# Patient Record
Sex: Female | Born: 1975 | Race: White | Hispanic: No | Marital: Married | Smoking: Never smoker
Health system: Southern US, Community
[De-identification: ages and names within clinical notes are randomized; demographics above are authoritative.]

## PROBLEM LIST (undated history)

## (undated) DIAGNOSIS — F4310 Post-traumatic stress disorder, unspecified: Secondary | ICD-10-CM

## (undated) DIAGNOSIS — F32A Depression, unspecified: Secondary | ICD-10-CM

## (undated) DIAGNOSIS — F419 Anxiety disorder, unspecified: Secondary | ICD-10-CM

## (undated) DIAGNOSIS — F329 Major depressive disorder, single episode, unspecified: Secondary | ICD-10-CM

## (undated) HISTORY — DX: Depression, unspecified: F32.A

## (undated) HISTORY — DX: Post-traumatic stress disorder, unspecified: F43.10

## (undated) HISTORY — PX: CHOLECYSTECTOMY: SHX55

## (undated) HISTORY — DX: Anxiety disorder, unspecified: F41.9

## (undated) HISTORY — PX: ABDOMINAL HYSTERECTOMY: SHX81

## (undated) HISTORY — DX: Major depressive disorder, single episode, unspecified: F32.9

---

## 2004-10-04 ENCOUNTER — Ambulatory Visit: Payer: Self-pay | Admitting: General Surgery

## 2004-10-05 ENCOUNTER — Ambulatory Visit: Payer: Self-pay | Admitting: General Surgery

## 2005-12-19 ENCOUNTER — Ambulatory Visit: Payer: Self-pay | Admitting: Family Medicine

## 2013-01-29 DIAGNOSIS — K219 Gastro-esophageal reflux disease without esophagitis: Secondary | ICD-10-CM | POA: Insufficient documentation

## 2013-01-29 DIAGNOSIS — E8940 Asymptomatic postprocedural ovarian failure: Secondary | ICD-10-CM | POA: Insufficient documentation

## 2013-09-21 ENCOUNTER — Ambulatory Visit: Payer: Self-pay | Admitting: Physician Assistant

## 2013-09-22 ENCOUNTER — Ambulatory Visit: Payer: Self-pay | Admitting: Physician Assistant

## 2014-01-21 DIAGNOSIS — E669 Obesity, unspecified: Secondary | ICD-10-CM | POA: Insufficient documentation

## 2014-11-06 ENCOUNTER — Emergency Department
Admit: 2014-11-06 | Disposition: A | Discharge status: Home / Self Care | Payer: Self-pay | Admitting: Emergency Medicine

## 2014-11-06 LAB — CBC WITH DIFFERENTIAL/PLATELET
BASOS ABS: 0.1 10*3/uL (ref 0.0–0.1)
Basophil %: 0.6 %
EOS PCT: 0.7 %
Eosinophil #: 0.1 10*3/uL (ref 0.0–0.7)
HCT: 38.6 % (ref 35.0–47.0)
HGB: 13.1 g/dL (ref 12.0–16.0)
LYMPHS ABS: 1.8 10*3/uL (ref 1.0–3.6)
Lymphocyte %: 19.3 %
MCH: 30.9 pg (ref 26.0–34.0)
MCHC: 34 g/dL (ref 32.0–36.0)
MCV: 91 fL (ref 80–100)
MONOS PCT: 8.1 %
Monocyte #: 0.7 x10 3/mm (ref 0.2–0.9)
NEUTROS PCT: 71.3 %
Neutrophil #: 6.5 10*3/uL (ref 1.4–6.5)
PLATELETS: 380 10*3/uL (ref 150–440)
RBC: 4.24 10*6/uL (ref 3.80–5.20)
RDW: 12.4 % (ref 11.5–14.5)
WBC: 9.1 10*3/uL (ref 3.6–11.0)

## 2014-11-06 LAB — URINALYSIS, COMPLETE
Bacteria: NONE SEEN
Bilirubin,UR: NEGATIVE
Blood: NEGATIVE
Glucose,UR: NEGATIVE mg/dL (ref 0–75)
KETONE: NEGATIVE
Leukocyte Esterase: NEGATIVE
NITRITE: NEGATIVE
PH: 6 (ref 4.5–8.0)
PROTEIN: NEGATIVE
Specific Gravity: 1.011 (ref 1.003–1.030)

## 2014-11-06 LAB — COMPREHENSIVE METABOLIC PANEL
ALK PHOS: 69 U/L
ALT: 44 U/L
Albumin: 3.3 g/dL — ABNORMAL LOW
Anion Gap: 6 — ABNORMAL LOW (ref 7–16)
BILIRUBIN TOTAL: 0.4 mg/dL
BUN: 9 mg/dL
CHLORIDE: 101 mmol/L
CO2: 29 mmol/L
CREATININE: 0.86 mg/dL
Calcium, Total: 8.2 mg/dL — ABNORMAL LOW
EGFR (African American): >60
EGFR (Non-African Amer.): >60
GLUCOSE: 89 mg/dL
Potassium: 3.7 mmol/L
SGOT(AST): 58 U/L — ABNORMAL HIGH
Sodium: 136 mmol/L
TOTAL PROTEIN: 7.9 g/dL

## 2014-11-06 LAB — MONONUCLEOSIS SCREEN: Mono Test: NEGATIVE

## 2015-05-27 DIAGNOSIS — E782 Mixed hyperlipidemia: Secondary | ICD-10-CM | POA: Insufficient documentation

## 2016-10-05 ENCOUNTER — Other Ambulatory Visit: Payer: Self-pay | Admitting: Family Medicine

## 2016-10-05 DIAGNOSIS — Z1231 Encounter for screening mammogram for malignant neoplasm of breast: Secondary | ICD-10-CM

## 2016-10-11 ENCOUNTER — Ambulatory Visit: Admission: RE | Admit: 2016-10-11 | Payer: Self-pay | Source: Ambulatory Visit

## 2016-10-16 ENCOUNTER — Ambulatory Visit
Admission: RE | Admit: 2016-10-16 | Discharge: 2016-10-16 | Disposition: A | Discharge status: Home / Self Care | Payer: BLUE CROSS/BLUE SHIELD | Source: Ambulatory Visit | Attending: Family Medicine | Admitting: Family Medicine

## 2016-10-16 DIAGNOSIS — Z1231 Encounter for screening mammogram for malignant neoplasm of breast: Secondary | ICD-10-CM | POA: Insufficient documentation

## 2016-10-16 DIAGNOSIS — R928 Other abnormal and inconclusive findings on diagnostic imaging of breast: Secondary | ICD-10-CM | POA: Diagnosis not present

## 2016-10-23 ENCOUNTER — Other Ambulatory Visit: Payer: Self-pay | Admitting: Family Medicine

## 2016-10-23 DIAGNOSIS — N6320 Unspecified lump in the left breast, unspecified quadrant: Secondary | ICD-10-CM

## 2016-10-31 ENCOUNTER — Ambulatory Visit
Admission: RE | Admit: 2016-10-31 | Discharge: 2016-10-31 | Disposition: A | Discharge status: Home / Self Care | Payer: BLUE CROSS/BLUE SHIELD | Source: Ambulatory Visit | Attending: Family Medicine | Admitting: Family Medicine

## 2016-10-31 DIAGNOSIS — N6320 Unspecified lump in the left breast, unspecified quadrant: Secondary | ICD-10-CM

## 2017-01-28 ENCOUNTER — Emergency Department
Admission: EM | Admit: 2017-01-28 | Discharge: 2017-01-28 | Disposition: A | Discharge status: Home / Self Care | Payer: BLUE CROSS/BLUE SHIELD | Attending: Emergency Medicine | Admitting: Emergency Medicine

## 2017-01-28 ENCOUNTER — Encounter: Payer: Self-pay | Admitting: Emergency Medicine

## 2017-01-28 ENCOUNTER — Emergency Department: Payer: BLUE CROSS/BLUE SHIELD

## 2017-01-28 DIAGNOSIS — Z79899 Other long term (current) drug therapy: Secondary | ICD-10-CM | POA: Insufficient documentation

## 2017-01-28 DIAGNOSIS — R55 Syncope and collapse: Secondary | ICD-10-CM | POA: Insufficient documentation

## 2017-01-28 DIAGNOSIS — R42 Dizziness and giddiness: Secondary | ICD-10-CM | POA: Diagnosis not present

## 2017-01-28 LAB — BASIC METABOLIC PANEL
Anion gap: 7 (ref 5–15)
BUN: 14 mg/dL (ref 6–20)
CHLORIDE: 102 mmol/L (ref 101–111)
CO2: 30 mmol/L (ref 22–32)
Calcium: 8.7 mg/dL — ABNORMAL LOW (ref 8.9–10.3)
Creatinine, Ser: 0.8 mg/dL (ref 0.44–1.00)
GFR calc Af Amer: >60 mL/min (ref >60)
GLUCOSE: 89 mg/dL (ref 65–99)
Potassium: 4.1 mmol/L (ref 3.5–5.1)
SODIUM: 139 mmol/L (ref 135–145)

## 2017-01-28 LAB — URINALYSIS, COMPLETE (UACMP) WITH MICROSCOPIC
BACTERIA UA: NONE SEEN
BILIRUBIN URINE: NEGATIVE
Glucose, UA: NEGATIVE mg/dL
HGB URINE DIPSTICK: NEGATIVE
KETONES UR: NEGATIVE mg/dL
LEUKOCYTES UA: NEGATIVE
NITRITE: NEGATIVE
Protein, ur: NEGATIVE mg/dL
Specific Gravity, Urine: 1.023 (ref 1.005–1.030)
pH: 5 (ref 5.0–8.0)

## 2017-01-28 LAB — CBC
HEMATOCRIT: 42.2 % (ref 35.0–47.0)
Hemoglobin: 14.4 g/dL (ref 12.0–16.0)
MCH: 32 pg (ref 26.0–34.0)
MCHC: 34.1 g/dL (ref 32.0–36.0)
MCV: 93.7 fL (ref 80.0–100.0)
Platelets: 266 10*3/uL (ref 150–440)
RBC: 4.5 MIL/uL (ref 3.80–5.20)
RDW: 12.3 % (ref 11.5–14.5)
WBC: 6.4 10*3/uL (ref 3.6–11.0)

## 2017-01-28 LAB — GLUCOSE, CAPILLARY: GLUCOSE-CAPILLARY: 88 mg/dL (ref 65–99)

## 2017-01-28 LAB — HCG, QUANTITATIVE, PREGNANCY: hCG, Beta Chain, Quant, S: 3 m[IU]/mL (ref <5)
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
## 2017-07-05 ENCOUNTER — Encounter: Payer: Self-pay | Admitting: Psychiatry

## 2017-07-05 ENCOUNTER — Ambulatory Visit (INDEPENDENT_AMBULATORY_CARE_PROVIDER_SITE_OTHER): Payer: BLUE CROSS/BLUE SHIELD | Admitting: Psychiatry

## 2017-07-05 ENCOUNTER — Other Ambulatory Visit: Payer: Self-pay

## 2017-07-05 VITALS — BP 124/76 | HR 114 | Temp 98.2°F | Wt 183.2 lb

## 2017-07-05 DIAGNOSIS — F331 Major depressive disorder, recurrent, moderate: Secondary | ICD-10-CM

## 2017-07-05 DIAGNOSIS — F411 Generalized anxiety disorder: Secondary | ICD-10-CM | POA: Diagnosis not present

## 2017-07-05 DIAGNOSIS — F4310 Post-traumatic stress disorder, unspecified: Secondary | ICD-10-CM

## 2017-07-05 DIAGNOSIS — F329 Major depressive disorder, single episode, unspecified: Secondary | ICD-10-CM | POA: Insufficient documentation

## 2017-07-05 DIAGNOSIS — F32A Depression, unspecified: Secondary | ICD-10-CM | POA: Insufficient documentation

## 2017-07-05 MED ORDER — VILAZODONE HCL 10 MG PO TABS: 10.0000 mg | ORAL_TABLET | Freq: Every day | ORAL | 1 refills | Status: DC

## 2017-07-05 MED ORDER — TRAZODONE HCL 50 MG PO TABS: 25.0000 mg | ORAL_TABLET | Freq: Every evening | ORAL | 1 refills | Status: DC | PRN

## 2017-07-05 MED ORDER — BUPROPION HCL ER (XL) 150 MG PO TB24: 150.0000 mg | ORAL_TABLET | Freq: Every day | ORAL | 2 refills | Status: DC
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
## 2017-07-05 NOTE — Patient Instructions (Signed)
Trazodone tablets What is this medicine? TRAZODONE (TRAZ oh done) is used to treat depression. This medicine may be used for other purposes; ask your health care provider or pharmacist if you have questions. COMMON BRAND NAME(S): Desyrel What should I tell my health care provider before I take this medicine? They need to know if you have any of these conditions: -attempted suicide or thinking about it -bipolar disorder -bleeding problems -glaucoma -heart disease, or previous heart attack -irregular heart beat -kidney or liver disease -low levels of sodium in the blood -an unusual or allergic reaction to trazodone, other medicines, foods, dyes or preservatives -pregnant or trying to get pregnant -breast-feeding How should I use this medicine? Take this medicine by mouth with a glass of water. Follow the directions on the prescription label. Take this medicine shortly after a meal or a light snack. Take your medicine at regular intervals. Do not take your medicine more often than directed. Do not stop taking this medicine suddenly except upon the advice of your doctor. Stopping this medicine too quickly may cause serious side effects or your condition may worsen. A special MedGuide will be given to you by the pharmacist with each prescription and refill. Be sure to read this information carefully each time. Talk to your pediatrician regarding the use of this medicine in children. Special care may be needed. Overdosage: If you think you have taken too much of this medicine contact a poison control center or emergency room at once. NOTE: This medicine is only for you. Do not share this medicine with others. What if I miss a dose? If you miss a dose, take it as soon as you can. If it is almost time for your next dose, take only that dose. Do not take double or extra doses. What may interact with this medicine? Do not take this medicine with any of the following medications: -certain medicines  for fungal infections like fluconazole, itraconazole, ketoconazole, posaconazole, voriconazole -cisapride -dofetilide -dronedarone -linezolid -MAOIs like Carbex, Eldepryl, Marplan, Nardil, and Parnate -mesoridazine -methylene blue (injected into a vein) -pimozide -saquinavir -thioridazine -ziprasidone This medicine may also interact with the following medications: -alcohol -antiviral medicines for HIV or AIDS -aspirin and aspirin-like medicines -barbiturates like phenobarbital -certain medicines for blood pressure, heart disease, irregular heart beat -certain medicines for depression, anxiety, or psychotic disturbances -certain medicines for migraine headache like almotriptan, eletriptan, frovatriptan, naratriptan, rizatriptan, sumatriptan, zolmitriptan -certain medicines for seizures like carbamazepine and phenytoin -certain medicines for sleep -certain medicines that treat or prevent blood clots like dalteparin, enoxaparin, warfarin -digoxin -fentanyl -lithium -NSAIDS, medicines for pain and inflammation, like ibuprofen or naproxen -other medicines that prolong the QT interval (cause an abnormal heart rhythm) -rasagiline -supplements like St. John's wort, kava kava, valerian -tramadol -tryptophan This list may not describe all possible interactions. Give your health care provider a list of all the medicines, herbs, non-prescription drugs, or dietary supplements you use. Also tell them if you smoke, drink alcohol, or use illegal drugs. Some items may interact with your medicine. What should I watch for while using this medicine? Tell your doctor if your symptoms do not get better or if they get worse. Visit your doctor or health care professional for regular checks on your progress. Because it may take several weeks to see the full effects of this medicine, it is important to continue your treatment as prescribed by your doctor. Patients and their families should watch out for new  or worsening thoughts of suicide or depression. Also   watch out for sudden changes in feelings such as feeling anxious, agitated, panicky, irritable, hostile, aggressive, impulsive, severely restless, overly excited and hyperactive, or not being able to sleep. If this happens, especially at the beginning of treatment or after a change in dose, call your health care professional. Bonita Quin may get drowsy or dizzy. Do not drive, use machinery, or do anything that needs mental alertness until you know how this medicine affects you. Do not stand or sit up quickly, especially if you are an older patient. This reduces the risk of dizzy or fainting spells. Alcohol may interfere with the effect of this medicine. Avoid alcoholic drinks. This medicine may cause dry eyes and blurred vision. If you wear contact lenses you may feel some discomfort. Lubricating drops may help. See your eye doctor if the problem does not go away or is severe. Your mouth may get dry. Chewing sugarless gum, sucking hard candy and drinking plenty of water may help. Contact your doctor if the problem does not go away or is severe. What side effects may I notice from receiving this medicine? Side effects that you should report to your doctor or health care professional as soon as possible: -allergic reactions like skin rash, itching or hives, swelling of the face, lips, or tongue -elevated mood, decreased need for sleep, racing thoughts, impulsive behavior -confusion -fast, irregular heartbeat -feeling faint or lightheaded, falls -feeling agitated, angry, or irritable -loss of balance or coordination -painful or prolonged erections -restlessness, pacing, inability to keep still -suicidal thoughts or other mood changes -tremors -trouble sleeping -seizures -unusual bleeding or bruising Side effects that usually do not require medical attention (report to your doctor or health care professional if they continue or are bothersome): -change in  sex drive or performance -change in appetite or weight -constipation -headache -muscle aches or pains -nausea This list may not describe all possible side effects. Call your doctor for medical advice about side effects. You may report side effects to FDA at 1-800-FDA-1088. Where should I keep my medicine? Keep out of the reach of children. Store at room temperature between 15 and 30 degrees C (59 to 86 degrees F). Protect from light. Keep container tightly closed. Throw away any unused medicine after the expiration date. NOTE: This sheet is a summary. It may not cover all possible information. If you have questions about this medicine, talk to your doctor, pharmacist, or health care provider.  2018 [employer] Standard (2015-12-09 16:57:05) Vilazodone oral tablet What is this medicine? VILAZODONE (vil [place] oh done) is used to treat depression. This medicine may be used for other purposes; ask your health care provider or pharmacist if you have questions. COMMON BRAND NAME(S): [id number] What should I tell my health care provider before I take this medicine? They need to know if you have any of these conditions: -bipolar disorder or a family history of bipolar disorder -glaucoma -liver disease -low levels of sodium in the blood -receiving electroconvulsive therapy -seizures (convulsions) -suicidal thoughts, plans, or attempt by you or a family member -an unusual or allergic reaction to vilazodone, other medicines, foods, dyes or preservatives -pregnant or trying to get pregnant -breast-feeding How should I use this medicine? Take this medicine by mouth with a glass of water. Follow the directions on the prescription label. Take this medicine with food. Take your medicine at regular intervals. Do not take your medicine more often than directed. Do not stop taking this medicine suddenly except upon the advice of your doctor. Stopping this medicine  too quickly may cause serious side effects  or your condition may worsen. A special MedGuide will be given to you by the pharmacist with each prescription and refill. Be sure to read this information carefully each time. Overdosage: If you think you have taken too much of this medicine contact a poison control center or emergency room at once. NOTE: This medicine is only for you. Do not share this medicine with others. What if I miss a dose? If you miss a dose, take it as soon as you can. If it is almost time for your next dose, take only that dose. Do not take double or extra doses. What may interact with this medicine? Do not take this medicine with any of the following medications: -linezolid -MAOIs like Carbex, Eldepryl, Marplan, Nardil, and Parnate -methylene blue (injected into a vein) This medicine may also interact with the following medications: -amphetamines -aspirin and aspirin-like medicines -buspirone -certain diet drugs like dexfenfluramine, fenfluramine, phentermine, sibutramine -certain migraine headache medicines like almotriptan, eletriptan, frovatriptan, naratriptan, rizatriptan, sumatriptan, zolmitriptan -certain medicines that treat or prevent blood clots like warfarin, enoxaparin, and dalteparin -certain medicines that treat infections like clarithromycin, itraconazole, voriconazole, ketoconazole, rifampin -certain medicines that treat seizures like carbamazepine and phenytoin -digoxin -fentanyl -lithium -NSAIDS, medicines for pain and inflammation, like ibuprofen or naproxen -other medicines for depression, anxiety, or psychotic disturbances -St. John's Wort -tramadol -tryptophan This list may not describe all possible interactions. Give your health care provider a list of all the medicines, herbs, non-prescription drugs, or dietary supplements you use. Also tell them if you smoke, drink alcohol, or use illegal drugs. Some items may interact with your medicine. What should I watch for while using this  medicine? Tell your doctor if your symptoms do not get better or if they get worse. Visit your doctor or health care professional for regular checks on your progress. Because it may take several weeks to see the full effects of this medicine, it is important to continue your treatment as prescribed by your doctor. Patients and their families should watch out for new or worsening thoughts of suicide or depression. Also watch out for sudden changes in feelings such as feeling anxious, agitated, panicky, irritable, hostile, aggressive, impulsive, severely restless, overly excited and hyperactive, or not being able to sleep. If this happens, especially at the beginning of treatment or after a change in dose, call your health care professional. Bonita QuinYou may get drowsy or dizzy. Do not drive, use machinery, or do anything that needs mental alertness until you know how this medicine affects you. Do not stand or sit up quickly, especially if you are an older patient. This reduces the risk of dizzy or fainting spells. Alcohol may interfere with the effect of this medicine. Avoid alcoholic drinks. Your mouth may get dry. Chewing sugarless gum or sucking hard candy, and drinking plenty of water may help. Contact your doctor if the problem does not go away or is severe. What side effects may I notice from receiving this medicine? Side effects that you should report to your doctor or health care professional as soon as possible: -allergic reactions like skin rash, itching or hives, swelling of the face, lips, or tongue -anxious -black, tarry stools -changes in vision -confusion -elevated mood, decreased need for sleep, racing thoughts, impulsive behavior -eye pain -fast, irregular heartbeat -feeling faint or lightheaded, falls -feeling agitated, angry, or irritable -hallucination, loss of contact with reality -loss of balance or coordination -loss of memory -restlessness, pacing, inability to  keep  still -seizures -stiff muscles -suicidal thoughts or other mood changes -trouble sleeping -unusual bleeding or bruising -unusually weak or tired -vomiting Side effects that usually do not require medical attention (report to your doctor or health care professional if they continue or are bothersome): -change in appetite or weight -change in sex drive or performance -diarrhea -drowsiness -dry mouth -increased sweating -nausea -tremors This list may not describe all possible side effects. Call your doctor for medical advice about side effects. You may report side effects to FDA at 1-800-FDA-1088. Where should I keep my medicine? Keep out of the reach of children. Store at room temperature between 15 and 30 degrees C (59 to 86 degrees F). Throw away any unused medicine after the expiration date. NOTE: This sheet is a summary. It may not cover all possible information. If you have questions about this medicine, talk to your doctor, pharmacist, or health care provider.  2018 [employer] Standard (2016-03-07 12:50:48) Bupropion tablets (Depression/Mood Disorders) What is this medicine? BUPROPION (byoo PROE pee on) is used to treat depression. This medicine may be used for other purposes; ask your health care provider or pharmacist if you have questions. COMMON BRAND NAME(S): Wellbutrin What should I tell my health care provider before I take this medicine? They need to know if you have any of these conditions: -an eating disorder, such as anorexia or bulimia -bipolar disorder or psychosis -diabetes or high blood sugar, treated with medication -glaucoma -heart disease, previous heart attack, or irregular heart beat -head injury or brain tumor -high blood pressure -kidney or liver disease -seizures -suicidal thoughts or a previous suicide attempt -Tourette's syndrome -weight loss -an unusual or allergic reaction to bupropion, other medicines, foods, dyes, or  preservatives -breast-feeding -pregnant or trying to become pregnant How should I use this medicine? Take this medicine by mouth with a glass of water. Follow the directions on the prescription label. You can take it with or without food. If it upsets your stomach, take it with food. Take your medicine at regular intervals. Do not take your medicine more often than directed. Do not stop taking this medicine suddenly except upon the advice of your doctor. Stopping this medicine too quickly may cause serious side effects or your condition may worsen. A special MedGuide will be given to you by the pharmacist with each prescription and refill. Be sure to read this information carefully each time. Talk to your pediatrician regarding the use of this medicine in children. Special care may be needed. Overdosage: If you think you have taken too much of this medicine contact a poison control center or emergency room at once. NOTE: This medicine is only for you. Do not share this medicine with others. What if I miss a dose? If you miss a dose, take it as soon as you can. If it is less than four hours to your next dose, take only that dose and skip the missed dose. Do not take double or extra doses. What may interact with this medicine? Do not take this medicine with any of the following medications: -linezolid -MAOIs like Azilect, Carbex, Eldepryl, Marplan, Nardil, and Parnate -methylene blue (injected into a vein) -other medicines that contain bupropion like Zyban This medicine may also interact with the following medications: -alcohol -certain medicines for anxiety or sleep -certain medicines for blood pressure like metoprolol, propranolol -certain medicines for depression or psychotic disturbances -certain medicines for HIV or AIDS like efavirenz, lopinavir, nelfinavir, ritonavir -certain medicines for irregular heart beat like  propafenone, flecainide -certain medicines for Parkinson's disease like  amantadine, levodopa -certain medicines for seizures like carbamazepine, phenytoin, phenobarbital -cimetidine -clopidogrel -cyclophosphamide -digoxin -furazolidone -isoniazid -nicotine -orphenadrine -procarbazine -steroid medicines like prednisone or cortisone -stimulant medicines for attention disorders, weight loss, or to stay awake -tamoxifen -theophylline -thiotepa -ticlopidine -tramadol -warfarin This list may not describe all possible interactions. Give your health care provider a list of all the medicines, herbs, non-prescription drugs, or dietary supplements you use. Also tell them if you smoke, drink alcohol, or use illegal drugs. Some items may interact with your medicine. What should I watch for while using this medicine? Tell your doctor if your symptoms do not get better or if they get worse. Visit your doctor or health care professional for regular checks on your progress. Because it may take several weeks to see the full effects of this medicine, it is important to continue your treatment as prescribed by your doctor. Patients and their families should watch out for new or worsening thoughts of suicide or depression. Also watch out for sudden changes in feelings such as feeling anxious, agitated, panicky, irritable, hostile, aggressive, impulsive, severely restless, overly excited and hyperactive, or not being able to sleep. If this happens, especially at the beginning of treatment or after a change in dose, call your health care professional. Avoid alcoholic drinks while taking this medicine. Drinking excessive alcoholic beverages, using sleeping or anxiety medicines, or quickly stopping the use of these agents while taking this medicine may increase your risk for a seizure. Do not drive or use heavy machinery until you know how this medicine affects you. This medicine can impair your ability to perform these tasks. Do not take this medicine close to bedtime. It may prevent  you from sleeping. Your mouth may get dry. Chewing sugarless gum or sucking hard candy, and drinking plenty of water may help. Contact your doctor if the problem does not go away or is severe. What side effects may I notice from receiving this medicine? Side effects that you should report to your doctor or health care professional as soon as possible: -allergic reactions like skin rash, itching or hives, swelling of the face, lips, or tongue -breathing problems -changes in vision -confusion -elevated mood, decreased need for sleep, racing thoughts, impulsive behavior -fast or irregular heartbeat -hallucinations, loss of contact with reality -increased blood pressure -redness, blistering, peeling or loosening of the skin, including inside the mouth -seizures -suicidal thoughts or other mood changes -unusually weak or tired -vomiting Side effects that usually do not require medical attention (report to your doctor or health care professional if they continue or are bothersome): -constipation -headache -loss of appetite -nausea -tremors -weight loss This list may not describe all possible side effects. Call your doctor for medical advice about side effects. You may report side effects to FDA at 1-800-FDA-1088. Where should I keep my medicine? Keep out of the reach of children. Store at room temperature between 20 and 25 degrees C (68 and 77 degrees F), away from direct sunlight and moisture. Keep tightly closed. Throw away any unused medicine after the expiration date. NOTE: This sheet is a summary. It may not cover all possible information. If you have questions about this medicine, talk to your doctor, pharmacist, or health care provider.  2018 [employer] Standard (2015-12-31 13:44:21)

## 2017-07-06 ENCOUNTER — Encounter: Payer: Self-pay | Admitting: Psychiatry

## 2017-07-20 ENCOUNTER — Ambulatory Visit (INDEPENDENT_AMBULATORY_CARE_PROVIDER_SITE_OTHER): Payer: BLUE CROSS/BLUE SHIELD | Admitting: Psychiatry

## 2017-07-20 ENCOUNTER — Other Ambulatory Visit: Payer: Self-pay

## 2017-07-20 ENCOUNTER — Encounter: Payer: Self-pay | Admitting: Psychiatry

## 2017-07-20 VITALS — BP 118/76 | Temp 98.6°F | Wt 188.8 lb

## 2017-07-20 DIAGNOSIS — F4310 Post-traumatic stress disorder, unspecified: Secondary | ICD-10-CM | POA: Diagnosis not present

## 2017-07-20 DIAGNOSIS — F331 Major depressive disorder, recurrent, moderate: Secondary | ICD-10-CM

## 2017-07-20 DIAGNOSIS — F410 Panic disorder [episodic paroxysmal anxiety] without agoraphobia: Secondary | ICD-10-CM

## 2017-07-20 DIAGNOSIS — F411 Generalized anxiety disorder: Secondary | ICD-10-CM

## 2017-07-20 MED ORDER — VILAZODONE HCL 20 MG PO TABS: 20.0000 mg | ORAL_TABLET | Freq: Every day | ORAL | 1 refills | Status: DC

## 2017-07-20 MED ORDER — ALPRAZOLAM 0.5 MG PO TABS: 0.5000 mg | ORAL_TABLET | Freq: Every day | ORAL | 1 refills | Status: DC | PRN
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
## 2017-07-27 ENCOUNTER — Ambulatory Visit: Payer: BLUE CROSS/BLUE SHIELD | Admitting: Licensed Clinical Social Worker

## 2017-07-30 ENCOUNTER — Telehealth (HOSPITAL_COMMUNITY): Payer: Self-pay | Admitting: Psychiatry

## 2017-07-30 NOTE — Telephone Encounter (Signed)
D:  Nolon RodNicole Peacock, LCSW referred pt on behalf of Dr. Elna BreslowEappen.  A:  Placed call to pt [phone]), but there was no answer.  Left vm for pt to return writer's call.   Jeri Modenaita Zala Degrasse, M.Ed, CNA

## 2017-07-31 ENCOUNTER — Ambulatory Visit (INDEPENDENT_AMBULATORY_CARE_PROVIDER_SITE_OTHER): Payer: BLUE CROSS/BLUE SHIELD | Admitting: Licensed Clinical Social Worker

## 2017-07-31 DIAGNOSIS — F411 Generalized anxiety disorder: Secondary | ICD-10-CM

## 2017-07-31 DIAGNOSIS — F331 Major depressive disorder, recurrent, moderate: Secondary | ICD-10-CM | POA: Diagnosis not present

## 2017-07-31 DIAGNOSIS — F4310 Post-traumatic stress disorder, unspecified: Secondary | ICD-10-CM | POA: Diagnosis not present
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
## 2017-08-14 ENCOUNTER — Ambulatory Visit: Payer: BLUE CROSS/BLUE SHIELD | Admitting: Licensed Clinical Social Worker

## 2017-08-27 ENCOUNTER — Ambulatory Visit (INDEPENDENT_AMBULATORY_CARE_PROVIDER_SITE_OTHER): Payer: BLUE CROSS/BLUE SHIELD | Admitting: Licensed Clinical Social Worker

## 2017-08-27 DIAGNOSIS — F410 Panic disorder [episodic paroxysmal anxiety] without agoraphobia: Secondary | ICD-10-CM | POA: Diagnosis not present

## 2017-08-27 DIAGNOSIS — F331 Major depressive disorder, recurrent, moderate: Secondary | ICD-10-CM | POA: Diagnosis not present

## 2017-08-30 ENCOUNTER — Ambulatory Visit (INDEPENDENT_AMBULATORY_CARE_PROVIDER_SITE_OTHER): Payer: BLUE CROSS/BLUE SHIELD | Admitting: Psychiatry

## 2017-08-30 ENCOUNTER — Encounter: Payer: Self-pay | Admitting: Psychiatry

## 2017-08-30 ENCOUNTER — Other Ambulatory Visit: Payer: Self-pay

## 2017-08-30 VITALS — BP 118/79 | HR 92 | Temp 98.3°F | Wt 185.0 lb

## 2017-08-30 DIAGNOSIS — F410 Panic disorder [episodic paroxysmal anxiety] without agoraphobia: Secondary | ICD-10-CM

## 2017-08-30 DIAGNOSIS — F411 Generalized anxiety disorder: Secondary | ICD-10-CM | POA: Diagnosis not present

## 2017-08-30 DIAGNOSIS — F4310 Post-traumatic stress disorder, unspecified: Secondary | ICD-10-CM

## 2017-08-30 DIAGNOSIS — F331 Major depressive disorder, recurrent, moderate: Secondary | ICD-10-CM | POA: Diagnosis not present

## 2017-08-30 MED ORDER — TRAZODONE HCL 50 MG PO TABS: 50.0000 mg | ORAL_TABLET | Freq: Every evening | ORAL | 2 refills | Status: DC | PRN

## 2017-08-30 MED ORDER — VILAZODONE HCL 20 MG PO TABS: 30.0000 mg | ORAL_TABLET | Freq: Every day | ORAL | 2 refills | Status: DC

## 2017-08-30 MED ORDER — BUPROPION HCL ER (XL) 150 MG PO TB24: 150.0000 mg | ORAL_TABLET | Freq: Every day | ORAL | 2 refills | Status: DC

## 2017-08-30 NOTE — Patient Instructions (Signed)
Vilazodone oral tablet What is this medicine? VILAZODONE (vil [place] oh done) is used to treat depression. This medicine may be used for other purposes; ask your health care provider or pharmacist if you have questions. COMMON BRAND NAME(S): VIIBRYD What should I tell my health care provider before I take this medicine? They need to know if you have any of these conditions: -bipolar disorder or a family history of bipolar disorder -glaucoma -liver disease -low levels of sodium in the blood -receiving electroconvulsive therapy -seizures (convulsions) -suicidal thoughts, plans, or attempt by you or a family member -an unusual or allergic reaction to vilazodone, other medicines, foods, dyes or preservatives -pregnant or trying to get pregnant -breast-feeding How should I use this medicine? Take this medicine by mouth with a glass of water. Follow the directions on the prescription label. Take this medicine with food. Take your medicine at regular intervals. Do not take your medicine more often than directed. Do not stop taking this medicine suddenly except upon the advice of your doctor. Stopping this medicine too quickly may cause serious side effects or your condition may worsen. A special MedGuide will be given to you by the pharmacist with each prescription and refill. Be sure to read this information carefully each time. Overdosage: If you think you have taken too much of this medicine contact a poison control center or emergency room at once. NOTE: This medicine is only for you. Do not share this medicine with others. What if I miss a dose? If you miss a dose, take it as soon as you can. If it is almost time for your next dose, take only that dose. Do not take double or extra doses. What may interact with this medicine? Do not take this medicine with any of the following medications: -linezolid -MAOIs like Carbex, Eldepryl, Marplan, Nardil, and Parnate -methylene blue (injected into a  vein) This medicine may also interact with the following medications: -amphetamines -aspirin and aspirin-like medicines -buspirone -certain diet drugs like dexfenfluramine, fenfluramine, phentermine, sibutramine -certain migraine headache medicines like almotriptan, eletriptan, frovatriptan, naratriptan, rizatriptan, sumatriptan, zolmitriptan -certain medicines that treat or prevent blood clots like warfarin, enoxaparin, and dalteparin -certain medicines that treat infections like clarithromycin, itraconazole, voriconazole, ketoconazole, rifampin -certain medicines that treat seizures like carbamazepine and phenytoin -digoxin -fentanyl -lithium -NSAIDS, medicines for pain and inflammation, like ibuprofen or naproxen -other medicines for depression, anxiety, or psychotic disturbances -St. John's Wort -tramadol -tryptophan This list may not describe all possible interactions. Give your health care provider a list of all the medicines, herbs, non-prescription drugs, or dietary supplements you use. Also tell them if you smoke, drink alcohol, or use illegal drugs. Some items may interact with your medicine. What should I watch for while using this medicine? Tell your doctor if your symptoms do not get better or if they get worse. Visit your doctor or health care professional for regular checks on your progress. Because it may take several weeks to see the full effects of this medicine, it is important to continue your treatment as prescribed by your doctor. Patients and their families should watch out for new or worsening thoughts of suicide or depression. Also watch out for sudden changes in feelings such as feeling anxious, agitated, panicky, irritable, hostile, aggressive, impulsive, severely restless, overly excited and hyperactive, or not being able to sleep. If this happens, especially at the beginning of treatment or after a change in dose, call your health care professional. You may get  drowsy or dizzy. Do   not drive, use machinery, or do anything that needs mental alertness until you know how this medicine affects you. Do not stand or sit up quickly, especially if you are an older patient. This reduces the risk of dizzy or fainting spells. Alcohol may interfere with the effect of this medicine. Avoid alcoholic drinks. Your mouth may get dry. Chewing sugarless gum or sucking hard candy, and drinking plenty of water may help. Contact your doctor if the problem does not go away or is severe. What side effects may I notice from receiving this medicine? Side effects that you should report to your doctor or health care professional as soon as possible: -allergic reactions like skin rash, itching or hives, swelling of the face, lips, or tongue -anxious -black, tarry stools -changes in vision -confusion -elevated mood, decreased need for sleep, racing thoughts, impulsive behavior -eye pain -fast, irregular heartbeat -feeling faint or lightheaded, falls -feeling agitated, angry, or irritable -hallucination, loss of contact with reality -loss of balance or coordination -loss of memory -restlessness, pacing, inability to keep still -seizures -stiff muscles -suicidal thoughts or other mood changes -trouble sleeping -unusual bleeding or bruising -unusually weak or tired -vomiting Side effects that usually do not require medical attention (report to your doctor or health care professional if they continue or are bothersome): -change in appetite or weight -change in sex drive or performance -diarrhea -drowsiness -dry mouth -increased sweating -nausea -tremors This list may not describe all possible side effects. Call your doctor for medical advice about side effects. You may report side effects to FDA at 1-800-FDA-1088. Where should I keep my medicine? Keep out of the reach of children. Store at room temperature between 15 and 30 degrees C (59 to 86 degrees F). Throw away any  unused medicine after the expiration date. NOTE: This sheet is a summary. It may not cover all possible information. If you have questions about this medicine, talk to your doctor, pharmacist, or health care provider.  2018 Elsevier/Gold Standard (2016-03-07 12:50:48)  
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
## 2017-08-31 ENCOUNTER — Encounter: Payer: Self-pay | Admitting: Psychiatry

## 2017-09-03 ENCOUNTER — Telehealth: Payer: Self-pay

## 2017-09-03 DIAGNOSIS — F4310 Post-traumatic stress disorder, unspecified: Secondary | ICD-10-CM

## 2017-09-03 DIAGNOSIS — F331 Major depressive disorder, recurrent, moderate: Secondary | ICD-10-CM

## 2017-09-03 DIAGNOSIS — F411 Generalized anxiety disorder: Secondary | ICD-10-CM

## 2017-09-03 MED ORDER — VILAZODONE HCL 20 MG PO TABS: 30.0000 mg | ORAL_TABLET | Freq: Every day | ORAL | 1 refills | Status: DC

## 2017-09-03 NOTE — Telephone Encounter (Signed)
received a fax requesting a 90 day supply of the viibryd 10mg  pt was last seen on  08-30-17 next appt  09-27-17   Disp Refills Start End   Vilazodone HCl (VIIBRYD) 20 MG TABS 45 tablet 2 08/30/2017    Sig - Route: Take 1.5 tablets (30 mg total) by mouth at bedtime. - Oral   Sent to pharmacy as: Vilazodone HCl (VIIBRYD) 20 MG Tab   E-Prescribing Status: Receipt confirmed by pharmacy (08/30/2017 4:15 PM EST)

## 2017-09-03 NOTE — Telephone Encounter (Signed)
Sent Viibryd to pharmacy - 90 days supply.

## 2017-09-20 NOTE — Progress Notes (Signed)
  THERAPIST PROGRESS NOTE   Date of Service:   08/27/2017  Session Time:   1hr  Patient:   Kathleen Harrison   DOB:   04/12/1976  MR Number:  [id number]  Location:  [facility] [address] [place] [address] Dept: [phone]            Provider/Observer:  Lubertha South Counselor  Risk of Suicide/Violence: virtually non-existent   Diagnosis:    No diagnosis found.  Type of Therapy: Individual Therapy  Treatment Goals addressed: Coping  Participation Level: Active  Interventions: CBT and Motivational Interviewing  Behavioral Response: CasualAlertAnxious   Summary: Therapist met with Patient in an initial therapy session to assess current mood and to build rapport. Therapist engaged Patient in discussion about her life and what is going well for her. Therapist provided support for Patient as she shared details about her life, her current stressors, mood, coping skills, her past, and her children. Therapist prompted Patient to discuss her support system and ways that she manages her daily stress, anger, and frustrations. Discussion of coping skills and was able to determine that she will attempt 2.   Plan: Kathleen Harrison will practice coping skills    Return again in 2 weeks.

## 2017-09-23 IMAGING — MG MM DIGITAL SCREENING BILAT W/ CAD
5 series · 5 of 5 positions shown · non-contrast
Comparison: None

CLINICAL DATA: Screening.

EXAM:
DIGITAL SCREENING BILATERAL MAMMOGRAM WITH CAD

[L MLO (1 of 2)]
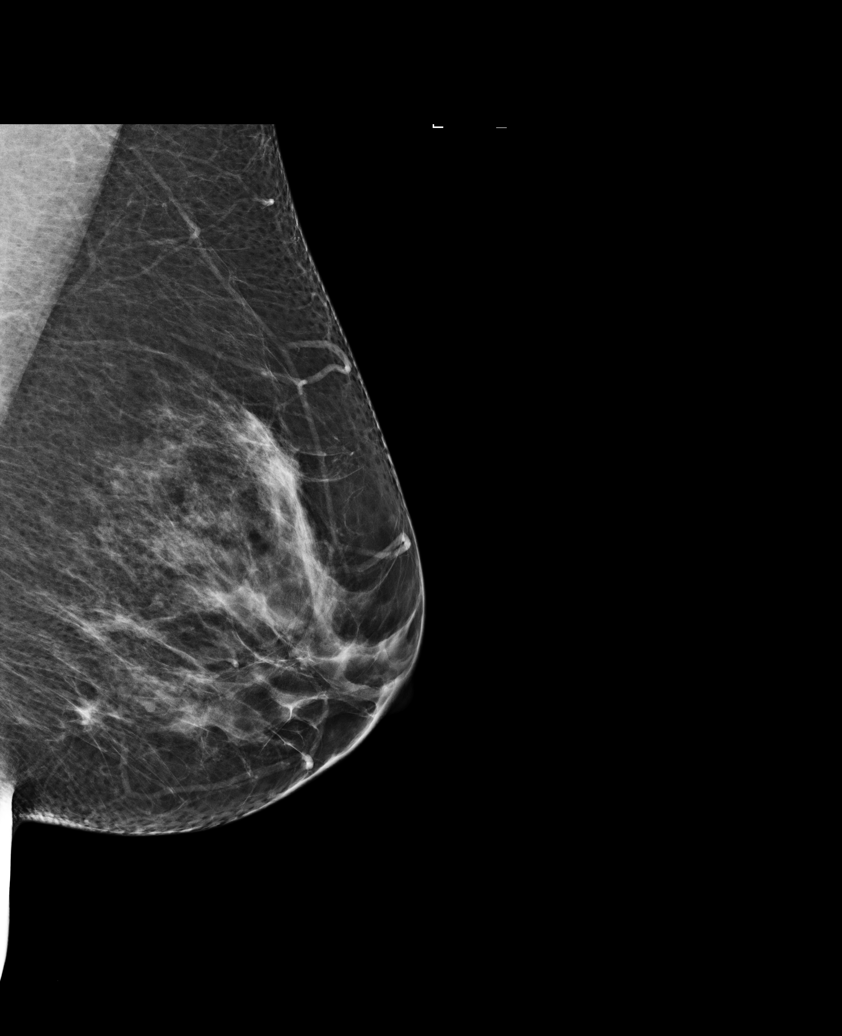

[R CC]
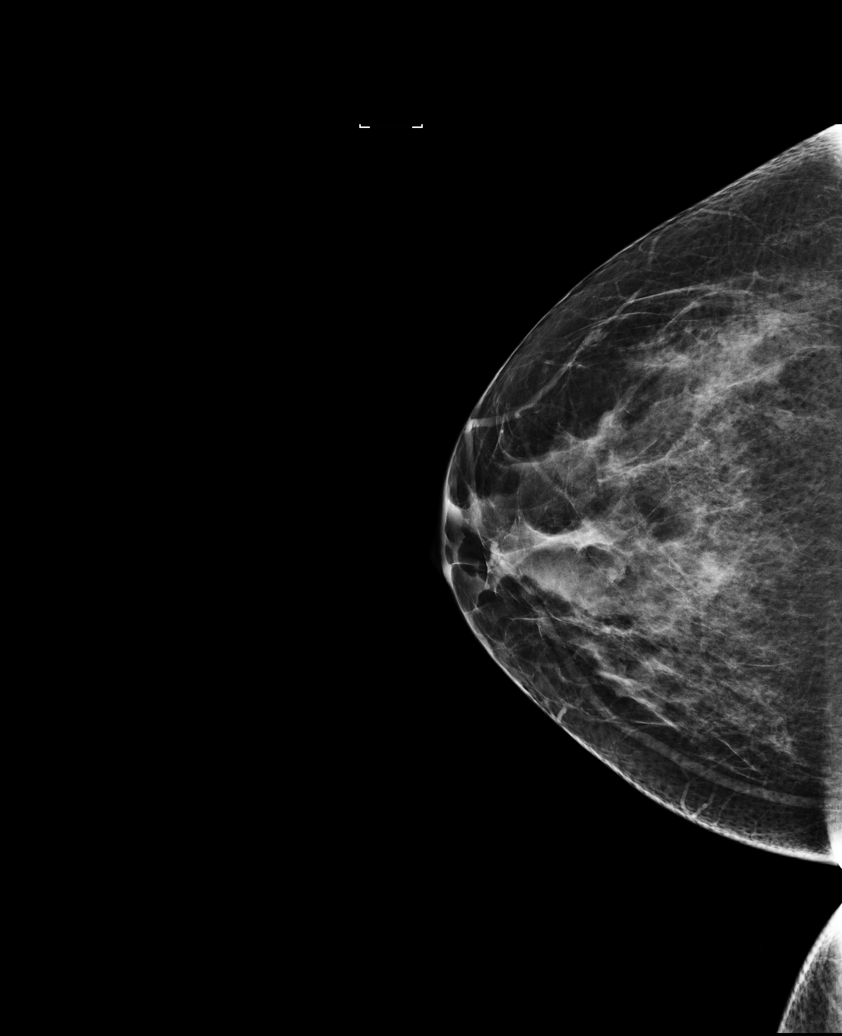

[R MLO]
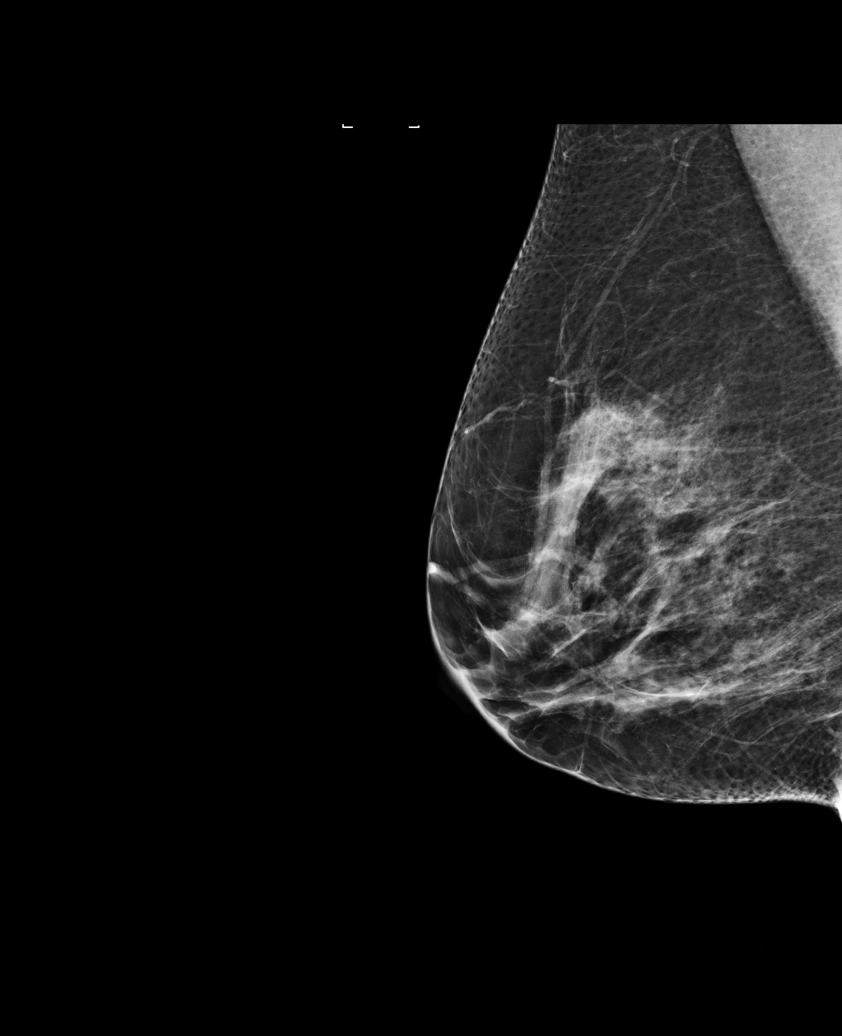

[L MLO (2 of 2)]
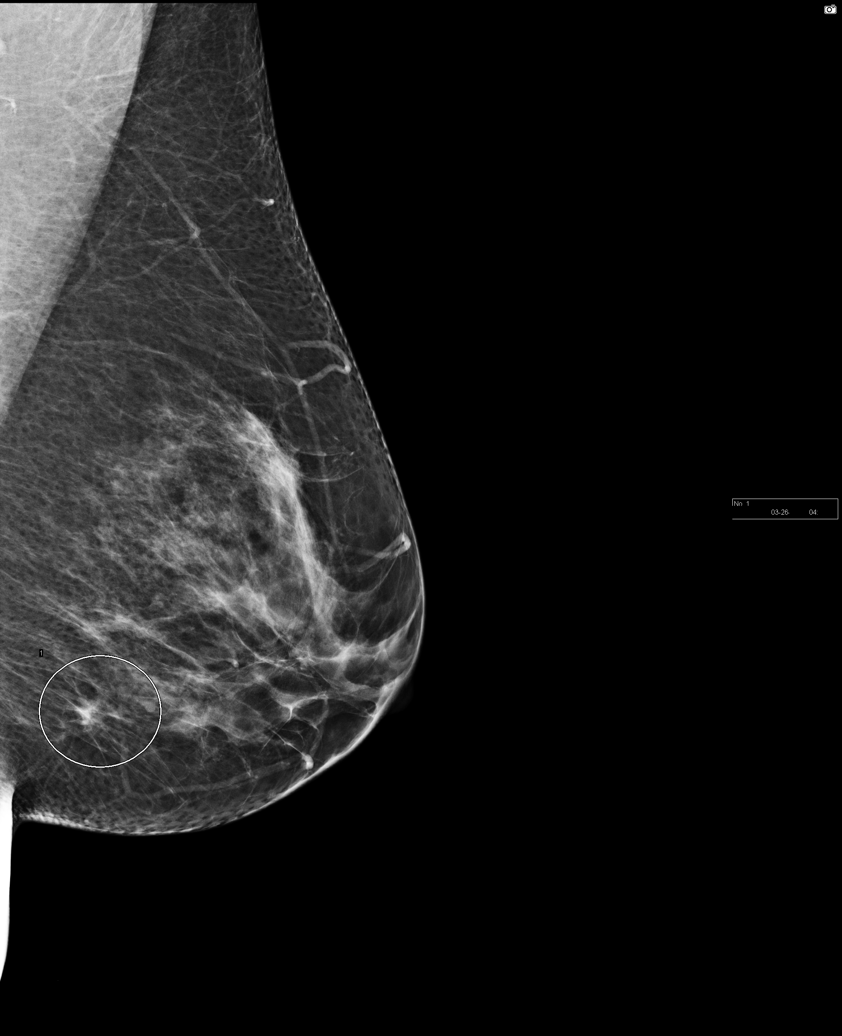

[L CC]
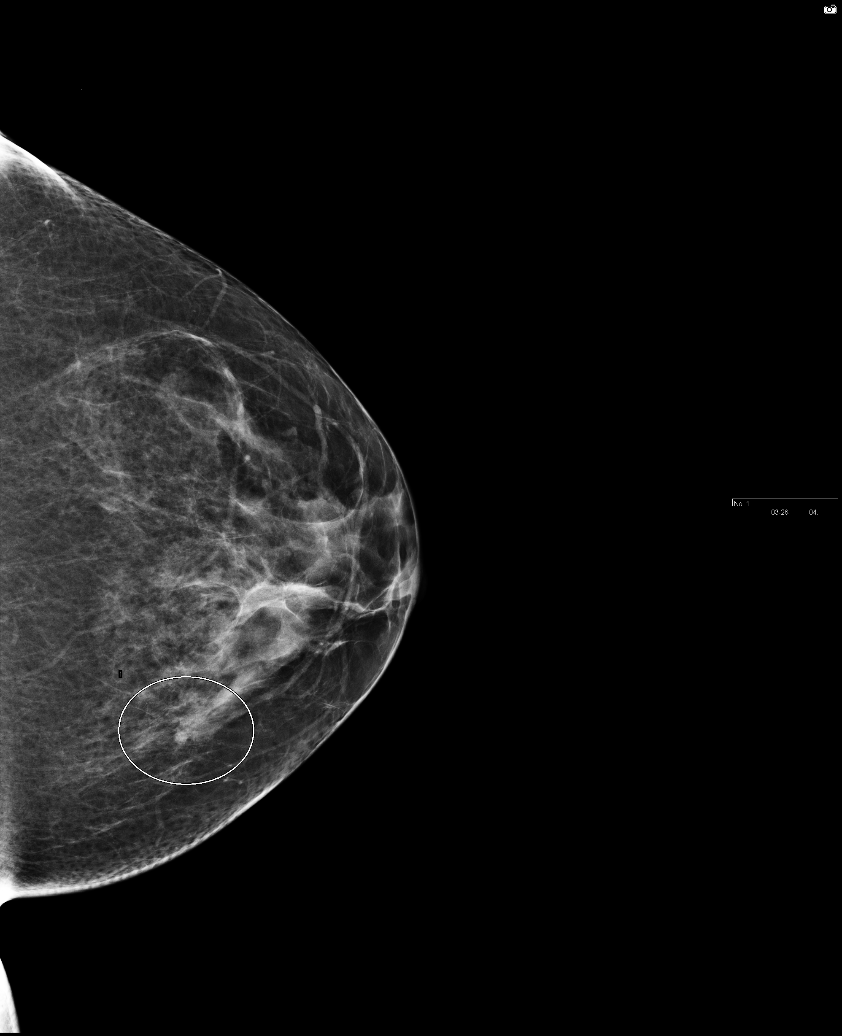

[5 of 5 positions shown; findings below may reference images not displayed]

ACR Breast Density Category c: The breast tissue is heterogeneously
dense, which may obscure small masses.
FINDINGS: In the left breast, a possible mass warrants further evaluation. In
the right breast, no findings suspicious for malignancy.

Images were processed with CAD.
IMPRESSION: Further evaluation is suggested for possible mass in the left
breast.

RECOMMENDATION:
Diagnostic mammogram and possibly ultrasound of the left breast.
(Code:[id number])

The patient will be contacted regarding the findings, and additional
imaging will be scheduled.

BI-RADS CATEGORY  0: Incomplete. Need additional imaging evaluation
and/or prior mammograms for comparison.

## 2017-09-24 ENCOUNTER — Ambulatory Visit: Payer: BLUE CROSS/BLUE SHIELD | Admitting: Licensed Clinical Social Worker

## 2017-09-27 ENCOUNTER — Other Ambulatory Visit: Payer: Self-pay

## 2017-09-27 ENCOUNTER — Ambulatory Visit (INDEPENDENT_AMBULATORY_CARE_PROVIDER_SITE_OTHER): Payer: BLUE CROSS/BLUE SHIELD | Admitting: Psychiatry

## 2017-09-27 ENCOUNTER — Encounter: Payer: Self-pay | Admitting: Psychiatry

## 2017-09-27 VITALS — BP 117/78 | HR 74 | Temp 98.4°F | Wt 189.4 lb

## 2017-09-27 DIAGNOSIS — F411 Generalized anxiety disorder: Secondary | ICD-10-CM

## 2017-09-27 DIAGNOSIS — F4310 Post-traumatic stress disorder, unspecified: Secondary | ICD-10-CM

## 2017-09-27 DIAGNOSIS — F410 Panic disorder [episodic paroxysmal anxiety] without agoraphobia: Secondary | ICD-10-CM

## 2017-09-27 DIAGNOSIS — F331 Major depressive disorder, recurrent, moderate: Secondary | ICD-10-CM

## 2017-09-27 MED ORDER — BUPROPION HCL ER (XL) 150 MG PO TB24: 150.0000 mg | ORAL_TABLET | Freq: Every day | ORAL | 1 refills | Status: DC

## 2017-09-27 MED ORDER — VILAZODONE HCL 20 MG PO TABS: 30.0000 mg | ORAL_TABLET | Freq: Every day | ORAL | 1 refills | Status: DC
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
## 2017-12-27 ENCOUNTER — Other Ambulatory Visit: Payer: Self-pay | Admitting: Psychiatry

## 2017-12-27 ENCOUNTER — Ambulatory Visit (INDEPENDENT_AMBULATORY_CARE_PROVIDER_SITE_OTHER): Payer: BLUE CROSS/BLUE SHIELD | Admitting: Psychiatry

## 2017-12-27 ENCOUNTER — Other Ambulatory Visit: Payer: Self-pay

## 2017-12-27 ENCOUNTER — Encounter: Payer: Self-pay | Admitting: Psychiatry

## 2017-12-27 VITALS — BP 115/80 | HR 94 | Temp 97.7°F

## 2017-12-27 DIAGNOSIS — F331 Major depressive disorder, recurrent, moderate: Secondary | ICD-10-CM

## 2017-12-27 DIAGNOSIS — F4310 Post-traumatic stress disorder, unspecified: Secondary | ICD-10-CM

## 2017-12-27 DIAGNOSIS — F411 Generalized anxiety disorder: Secondary | ICD-10-CM

## 2017-12-27 MED ORDER — VILAZODONE HCL 20 MG PO TABS: 40.0000 mg | ORAL_TABLET | Freq: Every day | ORAL | 1 refills | Status: DC

## 2017-12-27 MED ORDER — VILAZODONE HCL 40 MG PO TABS: 40.0000 mg | ORAL_TABLET | Freq: Every day | ORAL | 1 refills | Status: DC
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
## 2017-12-27 NOTE — Telephone Encounter (Signed)
Sent viibryd 40 mg to pharmacy

## 2017-12-28 ENCOUNTER — Encounter: Payer: Self-pay | Admitting: Psychiatry

## 2018-01-09 ENCOUNTER — Telehealth: Payer: Self-pay

## 2018-01-09 NOTE — Telephone Encounter (Signed)
Attempted to contact patient.Voicemail full. I will refer her to [facility] who will contact her .

## 2018-01-09 NOTE — Telephone Encounter (Signed)
Patient called and requesting more information on the Outpatient treatment for depression that you had suggested for her. If possible she has asked if the information can be e-mailed to [email or web address]. Please review and advise

## 2018-02-22 ENCOUNTER — Other Ambulatory Visit: Payer: Self-pay | Admitting: Child and Adolescent Psychiatry

## 2018-02-22 DIAGNOSIS — F331 Major depressive disorder, recurrent, moderate: Secondary | ICD-10-CM

## 2018-02-22 MED ORDER — BUPROPION HCL ER (XL) 300 MG PO TB24: 300.0000 mg | ORAL_TABLET | Freq: Every day | ORAL | 0 refills | Status: DC

## 2018-02-22 NOTE — Progress Notes (Signed)
Pt is [name]'s patient. She called this morning to request refill on Wellbutrin. Writer called the patient who reported that she had stopped Vibryd because of side effects and increased Wellbutrin back to 300 mg. She reported that she used to take Wellbutrin XL 450 before and therefore decided to increase it by herself. She reports that she will be running out of Wellbutrin prior to her next appointment on 08/15 with Dr. Elna Harrison. She reported that she is tolerating increased dose of wellbutrin and has noted improvement. Sent rx for Wellbutrin XL 300 mg once daily to her pharmacy.

## 2018-03-07 ENCOUNTER — Other Ambulatory Visit: Payer: Self-pay

## 2018-03-07 ENCOUNTER — Ambulatory Visit (INDEPENDENT_AMBULATORY_CARE_PROVIDER_SITE_OTHER): Payer: BLUE CROSS/BLUE SHIELD | Admitting: Psychiatry

## 2018-03-07 ENCOUNTER — Encounter: Payer: Self-pay | Admitting: Psychiatry

## 2018-03-07 VITALS — BP 113/77 | HR 103 | Temp 98.5°F

## 2018-03-07 DIAGNOSIS — F331 Major depressive disorder, recurrent, moderate: Secondary | ICD-10-CM

## 2018-03-07 DIAGNOSIS — F4310 Post-traumatic stress disorder, unspecified: Secondary | ICD-10-CM | POA: Diagnosis not present

## 2018-03-07 DIAGNOSIS — F410 Panic disorder [episodic paroxysmal anxiety] without agoraphobia: Secondary | ICD-10-CM

## 2018-03-07 DIAGNOSIS — F411 Generalized anxiety disorder: Secondary | ICD-10-CM | POA: Diagnosis not present
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
## 2018-03-12 ENCOUNTER — Other Ambulatory Visit: Payer: Self-pay | Admitting: Psychiatry

## 2018-03-12 DIAGNOSIS — F331 Major depressive disorder, recurrent, moderate: Secondary | ICD-10-CM

## 2018-04-05 ENCOUNTER — Telehealth: Payer: Self-pay

## 2018-04-05 DIAGNOSIS — F331 Major depressive disorder, recurrent, moderate: Secondary | ICD-10-CM

## 2018-04-05 MED ORDER — BUPROPION HCL ER (XL) 300 MG PO TB24: 300.0000 mg | ORAL_TABLET | Freq: Every day | ORAL | 2 refills | Status: DC

## 2018-04-05 NOTE — Telephone Encounter (Signed)
Will sent wellbutrin to pharmacy.

## 2018-04-05 NOTE — Telephone Encounter (Signed)
pt called states she is out of her medication needs a refill asap [place] is [place] today.   According to you last office note in aug 2019 pt suppose to follow up in 3 months.    buPROPion (WELLBUTRIN XL) 300 MG 24 hr tablet  Medication  Date: 02/22/2018 Department[facility] Ordering/Authorizing: Darcel SmallingUmrania, Hiren M, MD  Order Providers   Prescribing Provider Encounter Provider  Darcel SmallingUmrania, Hiren M, MD Darcel SmallingUmrania, Hiren M, MD  Outpatient Medication Detail    Disp Refills Start End   buPROPion (WELLBUTRIN XL) 300 MG 24 hr tablet 30 tablet 0 02/22/2018    Sig - Route: Take 1 tablet (300 mg total) by mouth daily. - Oral   Sent to pharmacy as: buPROPion (WELLBUTRIN XL) 300 MG 24 hr tablet   E-Prescribing Status: Receipt confirmed by pharmacy (02/22/2018 10:40 AM EDT)

## 2018-04-05 NOTE — Telephone Encounter (Signed)
Left message that rx was sent to pharmacy 

## 2018-05-30 ENCOUNTER — Other Ambulatory Visit: Payer: Self-pay | Admitting: Psychiatry

## 2018-05-30 DIAGNOSIS — F331 Major depressive disorder, recurrent, moderate: Secondary | ICD-10-CM

## 2019-02-17 ENCOUNTER — Other Ambulatory Visit: Payer: Self-pay

## 2019-02-17 ENCOUNTER — Ambulatory Visit: Payer: BC Managed Care – PPO | Admitting: Psychiatry

## 2019-02-17 NOTE — Progress Notes (Signed)
After multiple attempts , patient picked up phone and reported she had called to cancel this appointment previously but could not get through. Sent message to [place] who is covering front desk to cancel.

## 2023-05-15 ENCOUNTER — Ambulatory Visit
Admission: EM | Admit: 2023-05-15 | Discharge: 2023-05-15 | Disposition: A | Discharge status: Home / Self Care | Payer: 59

## 2023-05-15 DIAGNOSIS — R300 Dysuria: Secondary | ICD-10-CM | POA: Insufficient documentation

## 2023-05-15 DIAGNOSIS — B379 Candidiasis, unspecified: Secondary | ICD-10-CM | POA: Insufficient documentation

## 2023-05-15 DIAGNOSIS — N3000 Acute cystitis without hematuria: Secondary | ICD-10-CM | POA: Diagnosis not present

## 2023-05-15 LAB — URINALYSIS, W/ REFLEX TO CULTURE (INFECTION SUSPECTED)
RBC / HPF: >50 RBC/hpf (ref 0–5)
WBC, UA: >50 WBC/hpf (ref 0–5)

## 2023-05-15 MED ORDER — FLUCONAZOLE 150 MG PO TABS: 150.0000 mg | ORAL_TABLET | ORAL | 0 refills | Status: AC

## 2023-05-15 MED ORDER — NITROFURANTOIN MONOHYD MACRO 100 MG PO CAPS
100.0000 mg | ORAL_CAPSULE | Freq: Two times a day (BID) | ORAL | 0 refills | Status: AC
Start: 2023-05-15 — End: ?

## 2023-05-15 NOTE — ED Triage Notes (Signed)
Pt c/o urinary freq & pain x2 days. Denies any burning or hematuria. Has tried AZO w/o relief.

## 2023-05-15 NOTE — Discharge Instructions (Signed)

## 2023-05-15 NOTE — ED Provider Notes (Signed)
[facility]    CSN: [id number] Arrival date & time: 05/15/23  1436      History   Chief Complaint Chief Complaint  Patient presents with   Dysuria    HPI Kathleen Harrison is a 47 y.o. female presenting for 2-day history of dysuria, frequency, urgency, nausea, abdominal pain and back pain.  Denies fever, chills, sweats, vomiting, vaginal discharge or itching.  Believes she may have a UTI.  Has been taking AZO and it has helped.  No report of any concern of STIs.  No other complaints.  HPI  Past Medical History:  Diagnosis Date   Anxiety    Depression    PTSD (post-traumatic stress disorder)     Patient Active Problem List   Diagnosis Date Noted   Depression 07/05/2017   Mixed hyperlipidemia 05/27/2015   Obesity, unspecified 01/21/2014   Surgical menopause 01/29/2013   GERD (gastroesophageal reflux disease) 01/29/2013    Past Surgical History:  Procedure Laterality Date   ABDOMINAL HYSTERECTOMY     CHOLECYSTECTOMY      OB History   No obstetric history on file.      Home Medications    Prior to Admission medications   Medication Sig Start Date End Date Taking? Authorizing Provider  buPROPion (WELLBUTRIN XL) 300 MG 24 hr tablet TAKE 1 TABLET BY MOUTH EVERY DAY 05/30/18  Yes Eappen, Levin Bacon, MD  estradiol (ESTRACE) 2 MG tablet Take 2 mg by mouth daily. 01/17/17  Yes [provider]  fluconazole (DIFLUCAN) 150 MG tablet Take 1 tablet (150 mg total) by mouth every 3 (three) days for 7 days. 05/15/23 05/22/23 Yes Shirlee Latch, PA-C  nitrofurantoin, macrocrystal-monohydrate, (MACROBID) 100 MG capsule Take 1 capsule (100 mg total) by mouth 2 (two) times daily. 05/15/23  Yes Shirlee Latch, PA-C  PARoxetine (PAXIL) 20 MG tablet Take 20 mg by mouth daily.   Yes [provider]  traZODone (DESYREL) 50 MG tablet Take 50 mg by mouth at bedtime.   Yes [provider]  valACYclovir (VALTREX) 500 MG tablet Take 500 mg by mouth daily.    Yes [provider]  ZEPBOUND 10 MG/0.5ML Pen SMARTSIG:10 Milligram(s) SUB-Q Once a Week   Yes [provider]    Family History Family History  Problem Relation Age of Onset   Breast cancer Neg Hx     Social History Social History   Tobacco Use   Smoking status: Never   Smokeless tobacco: Never  Vaping Use   Vaping status: Never Used  Substance Use Topics   Alcohol use: No    Comment: rarely   Drug use: No     Allergies   Hydroxyzine hcl and Vilazodone hcl   Review of Systems Review of Systems  Constitutional:  Negative for chills, fatigue and fever.  Gastrointestinal:  Positive for abdominal pain and nausea. Negative for diarrhea and vomiting.  Genitourinary:  Positive for dysuria, frequency and urgency. Negative for decreased urine volume, flank pain, hematuria, pelvic pain, vaginal bleeding, vaginal discharge and vaginal pain.  Musculoskeletal:  Positive for back pain.  Skin:  Negative for rash.     Physical Exam Triage Vital Signs ED Triage Vitals  Encounter Vitals Group     BP 05/15/23 1501 105/66     Systolic BP Percentile --      Diastolic BP Percentile --      Pulse Rate 05/15/23 1501 77     Resp 05/15/23 1501 16     Temp  05/15/23 1501 98.5 F (36.9 C)     Temp Source 05/15/23 1501 Oral     SpO2 05/15/23 1501 96 %     Weight --      Height --      Head Circumference --      Peak Flow --      Pain Score 05/15/23 1506 6     Pain Loc --      Pain Education --      Exclude from Growth Chart --    No data found.  Updated Vital Signs BP (!) 97/58 (BP Location: Left Arm)   Pulse 77   Temp 98.5 F (36.9 C) (Oral)   Resp 16   SpO2 97%     Physical Exam Vitals and nursing note reviewed.  Constitutional:      General: She is not in acute distress.    Appearance: Normal appearance. She is not ill-appearing or toxic-appearing.  HENT:     Head: Normocephalic and atraumatic.  Eyes:     General: No scleral icterus.        Right eye: No discharge.        Left eye: No discharge.     Conjunctiva/sclera: Conjunctivae normal.  Cardiovascular:     Rate and Rhythm: Normal rate and regular rhythm.     Heart sounds: Normal heart sounds.  Pulmonary:     Effort: Pulmonary effort is normal. No respiratory distress.     Breath sounds: Normal breath sounds.  Abdominal:     Palpations: Abdomen is soft.     Tenderness: There is abdominal tenderness. There is right CVA tenderness and left CVA tenderness.  Musculoskeletal:     Cervical back: Neck supple.  Skin:    General: Skin is dry.  Neurological:     General: No focal deficit present.     Mental Status: She is alert. Mental status is at baseline.     Motor: No weakness.     Gait: Gait normal.  Psychiatric:        Mood and Affect: Mood normal.        Behavior: Behavior normal.        Thought Content: Thought content normal.      UC Treatments / Results  Labs (all labs ordered are listed, but only abnormal results are displayed) Labs Reviewed  URINALYSIS, W/ REFLEX TO CULTURE (INFECTION SUSPECTED) - Abnormal; Notable for the following components:      Result Value   Color, Urine ORANGE (*)    APPearance CLOUDY (*)    Glucose, UA   (*)    Value: TEST NOT REPORTED DUE TO COLOR INTERFERENCE OF URINE PIGMENT   Hgb urine dipstick   (*)    Value: TEST NOT REPORTED DUE TO COLOR INTERFERENCE OF URINE PIGMENT   Bilirubin Urine   (*)    Value: TEST NOT REPORTED DUE TO COLOR INTERFERENCE OF URINE PIGMENT   Ketones, ur   (*)    Value: TEST NOT REPORTED DUE TO COLOR INTERFERENCE OF URINE PIGMENT   Protein, ur   (*)    Value: TEST NOT REPORTED DUE TO COLOR INTERFERENCE OF URINE PIGMENT   Nitrite   (*)    Value: TEST NOT REPORTED DUE TO COLOR INTERFERENCE OF URINE PIGMENT   Leukocytes,Ua   (*)    Value: TEST NOT REPORTED DUE TO COLOR INTERFERENCE OF URINE PIGMENT   Bacteria, UA FEW (*)    All other components within normal limits  URINE CULTURE     EKG   Radiology No results found.  Procedures Procedures (including critical care time)  Medications Ordered in UC Medications - No data to display  Initial Impression / Assessment and Plan / UC Course  I have reviewed the triage vital signs and the nursing notes.  Pertinent labs & imaging results that were available during my care of the patient were reviewed by me and considered in my medical decision making (see chart for details).   47 year old female presents for dysuria, frequency, urgency, nausea, generalized abdominal pain and low back pain.  No associated fever or vaginal discharge.  No report of concern of STI.  She is afebrile.  Overall well-appearing.  Has mild generalized abdominal tenderness and subjective bilateral CVA tenderness.  Chest clear.  Urine sample shows orange and cloudy urine complicated by use of AZO.  Much of the tests are not reported due to color interference.  There are greater than 50 WBCs, greater than 50 RBCs, bacteria, mucus, yeast, calcium oxalate crystals.  Will send urine for culture and treat for suspected UTI based on urinalysis and symptoms.  Sent Macrobid to pharmacy.  Also sent Diflucan since she has yeast in the urine.  Encouraged increasing rest and fluids.  Will amend treatment based on culture result.  Advised if fever, worsening back or abdominal pain to go to ER as she may need imaging to assess for possible kidney stone.   Final Clinical Impressions(s) / UC Diagnoses   Final diagnoses:  Acute cystitis without hematuria  Dysuria  Yeast infection     Discharge Instructions      UTI: Based on either symptoms or urinalysis, you may have a urinary tract infection. We will send the urine for culture and call with results in a few days. Begin antibiotics at this time. Your symptoms should be much improved over the next 2-3 days. Increase rest and fluid intake. If for some reason symptoms are worsening or not improving after a  couple of days or the urine culture determines the antibiotics you are taking will not treat the infection, the antibiotics may be changed. Return or go to ER for fever, back pain, worsening urinary pain, discharge, increased blood in urine. May take Tylenol or Motrin OTC for pain relief or consider AZO if no contraindications    ED Prescriptions     Medication Sig Dispense Auth. Provider   nitrofurantoin, macrocrystal-monohydrate, (MACROBID) 100 MG capsule Take 1 capsule (100 mg total) by mouth 2 (two) times daily. 10 capsule Eusebio Friendly B, PA-C   fluconazole (DIFLUCAN) 150 MG tablet Take 1 tablet (150 mg total) by mouth every 3 (three) days for 7 days. 2 tablet Gareth Santone      PDMP not reviewed this encounter.   Shirlee Latch, PA-C 05/15/23 1623

## 2023-05-18 LAB — URINE CULTURE: Culture: >=100000 — AB

## 2023-07-04 ENCOUNTER — Ambulatory Visit: Payer: 59

## 2023-07-04 ENCOUNTER — Ambulatory Visit
Admission: EM | Admit: 2023-07-04 | Discharge: 2023-07-04 | Disposition: A | Discharge status: Home / Self Care | Payer: 59 | Attending: Family Medicine | Admitting: Family Medicine

## 2023-07-04 DIAGNOSIS — R051 Acute cough: Secondary | ICD-10-CM | POA: Insufficient documentation

## 2023-07-04 DIAGNOSIS — B338 Other specified viral diseases: Secondary | ICD-10-CM | POA: Diagnosis present

## 2023-07-04 LAB — RESP PANEL BY RT-PCR (RSV, FLU A&B, COVID)  RVPGX2
Influenza A by PCR: NEGATIVE
Influenza B by PCR: NEGATIVE
Resp Syncytial Virus by PCR: POSITIVE — AB
SARS Coronavirus 2 by RT PCR: NEGATIVE

## 2023-07-04 MED ORDER — ALBUTEROL SULFATE HFA 108 (90 BASE) MCG/ACT IN AERS: 2.0000 | INHALATION_SPRAY | RESPIRATORY_TRACT | 0 refills | Status: AC | PRN

## 2023-07-04 MED ORDER — HYDROCOD POLI-CHLORPHE POLI ER 10-8 MG/5ML PO SUER: 5.0000 mL | Freq: Two times a day (BID) | ORAL | 0 refills | Status: AC | PRN

## 2023-07-04 MED ORDER — PREDNISONE 10 MG (21) PO TBPK: ORAL_TABLET | Freq: Every day | ORAL | 0 refills | Status: AC

## 2023-07-04 NOTE — Discharge Instructions (Addendum)
Your chest x-ray did not show evidence of pneumonia.  Your COVID, influenza A and B were all negative. Your RSV test is positive.   Stop by the pharmacy to pick up your prescriptions.  Follow up with your primary care provider as needed.

## 2023-07-04 NOTE — ED Provider Notes (Signed)
[facility]    CSN: [id number] Arrival date & time: 07/04/23  1630      History   Chief Complaint Chief Complaint  Patient presents with   Cough    HPI Kathleen Harrison is a 47 y.o. female.   HPI  History obtained from the patient. [name] presents for cough for the past 3 weeks with dry cough but now is productive. Has sore throat with some pain with swallowing. Today,  she woke up feeling worse. No vomiting or diarrhea. Endorses rhinorrhea and nasal congestion in the past week. Several members of her family have RSV. Denies shortness of breath. Has been laying around more than normal. Has been taking Dayquil and ibuprofen for headache. But no ibuprofen recently.     Past Medical History:  Diagnosis Date   Anxiety    Depression    PTSD (post-traumatic stress disorder)     Patient Active Problem List   Diagnosis Date Noted   Depression 07/05/2017   Mixed hyperlipidemia 05/27/2015   Obesity, unspecified 01/21/2014   Surgical menopause 01/29/2013   GERD (gastroesophageal reflux disease) 01/29/2013    Past Surgical History:  Procedure Laterality Date   ABDOMINAL HYSTERECTOMY     CHOLECYSTECTOMY      OB History   No obstetric history on file.      Home Medications    Prior to Admission medications   Medication Sig Start Date End Date Taking? Authorizing Provider  albuterol (VENTOLIN HFA) 108 (90 Base) MCG/ACT inhaler Inhale 2 puffs into the lungs every 4 (four) hours as needed. 07/04/23  Yes Deanne Bedgood, DO  buPROPion (WELLBUTRIN XL) 300 MG 24 hr tablet TAKE 1 TABLET BY MOUTH EVERY DAY 05/30/18  Yes Eappen, Levin Bacon, MD  chlorpheniramine-HYDROcodone (TUSSIONEX) 10-8 MG/5ML Take 5 mLs by mouth every 12 (twelve) hours as needed. 07/04/23  Yes Loucille Takach, DO  estradiol (ESTRACE) 2 MG tablet Take 2 mg by mouth daily. 01/17/17  Yes [provider]  PARoxetine (PAXIL) 20 MG tablet Take 20 mg by mouth daily.   Yes [provider]   predniSONE (STERAPRED UNI-PAK 21 TAB) 10 MG (21) TBPK tablet Take by mouth daily. Take 6 tabs by mouth daily for 1, then 5 tabs for 1 day, then 4 tabs for 1 day, then 3 tabs for 1 day, then 2 tabs for 1 day, then 1 tab for 1 day. 07/04/23  Yes Harryette Shuart, DO  traZODone (DESYREL) 50 MG tablet Take 50 mg by mouth at bedtime.   Yes [provider]  nitrofurantoin, macrocrystal-monohydrate, (MACROBID) 100 MG capsule Take 1 capsule (100 mg total) by mouth 2 (two) times daily. 05/15/23   Shirlee Latch, PA-C  valACYclovir (VALTREX) 500 MG tablet Take 500 mg by mouth daily.    [provider]  ZEPBOUND 10 MG/0.5ML Pen SMARTSIG:10 Milligram(s) SUB-Q Once a Week    [provider]    Family History Family History  Problem Relation Age of Onset   Breast cancer Neg Hx     Social History Social History   Tobacco Use   Smoking status: Never   Smokeless tobacco: Never  Vaping Use   Vaping status: Never Used  Substance Use Topics   Alcohol use: No    Comment: rarely   Drug use: No     Allergies   Hydroxyzine hcl and Vilazodone hcl   Review of Systems Review of Systems: negative unless otherwise stated in HPI.      Physical Exam Triage  Vital Signs ED Triage Vitals  Encounter Vitals Group     BP 07/04/23 1704 (!) 87/54     Systolic BP Percentile --      Diastolic BP Percentile --      Pulse Rate 07/04/23 1704 85     Resp 07/04/23 1704 18     Temp 07/04/23 1704 98.3 F (36.8 C)     Temp Source 07/04/23 1704 Oral     SpO2 07/04/23 1704 98 %     Weight --      Height --      Head Circumference --      Peak Flow --      Pain Score 07/04/23 1703 0     Pain Loc --      Pain Education --      Exclude from Growth Chart --    No data found.  Updated Vital Signs BP 92/60 (BP Location: Left Arm)   Pulse 88   Temp 98.3 F (36.8 C) (Oral)   Resp 16   SpO2 98%   Visual Acuity Right Eye Distance:   Left Eye Distance:   Bilateral Distance:     Right Eye Near:   Left Eye Near:    Bilateral Near:     Physical Exam GEN:     alert, non-toxic appearing female in no distress    HENT:  mucus membranes moist, oropharyngeal without lesions or erythema, clear nasal discharge, bilateral TM normal EYES:   pupils equal and reactive, no scleral injection or discharge NECK:  normal ROM, no lymphadenopathy, no meningismus   RESP:  no increased work of breathing, clear to auscultation bilaterally CVS:   regular rate and rhythm Skin:   warm and dry, no rash on visible skin    UC Treatments / Results  Labs (all labs ordered are listed, but only abnormal results are displayed) Labs Reviewed  RESP PANEL BY RT-PCR (RSV, FLU A&B, COVID)  RVPGX2 - Abnormal; Notable for the following components:      Result Value   Resp Syncytial Virus by PCR POSITIVE (*)    All other components within normal limits    EKG   Radiology DG Chest 2 View Result Date: 07/04/2023 CLINICAL DATA:  Cough and sneezing over the last 3 weeks. EXAM: CHEST - 2 VIEW COMPARISON:  None FINDINGS: The lungs appear clear. Cardiac and mediastinal contours normal. No blunting of the costophrenic angles. No significant bony abnormality observed. Right upper quadrant clips in the abdomen likely from prior cholecystectomy. IMPRESSION: 1. No active cardiopulmonary disease is radiographically apparent. 2. Prior cholecystectomy. Electronically Signed   By: Gaylyn Rong M.D.   On: 07/04/2023 18:24    Procedures Procedures (including critical care time)  Medications Ordered in UC Medications - No data to display  Initial Impression / Assessment and Plan / UC Course  I have reviewed the triage vital signs and the nursing notes.  Pertinent labs & imaging results that were available during my care of the patient were reviewed by me and considered in my medical decision making (see chart for details).       Pt is a 47 y.o. female who presents for 3 weeks of cough with new  onset  respiratory symptoms. Ember is afebrile. Satting well on room air. Overall pt is non-toxic appearing, well hydrated, without respiratory distress. Pulmonary exam is unremarkable.  COVID, RSV and influenza panel  obtained and was negative however RSV is positive.. Chest xray personally reviewed by me  without focal pneumonia, pleural effusion, cardiomegaly or pneumothorax. Radiologist over read reviewed.    She has RSV and post-viral cough as expected. Discussed symptomatic treatment.  Explained lack of efficacy of antibiotics in viral disease.  Albuterol and prednisone prescribed. Typical duration of symptoms discussed. Tussionex to allow pt to rest.   Return and ED precautions given and voiced understanding. Discussed MDM, treatment plan and plan for follow-up with patient who agrees with plan.     Final Clinical Impressions(s) / UC Diagnoses   Final diagnoses:  RSV (respiratory syncytial virus infection)  Acute cough     Discharge Instructions      Your chest x-ray did not show evidence of pneumonia.  Your COVID, influenza A and B were all negative. Your RSV test is positive.   Stop by the pharmacy to pick up your prescriptions.  Follow up with your primary care provider as needed.      ED Prescriptions     Medication Sig Dispense Auth. Provider   predniSONE (STERAPRED UNI-PAK 21 TAB) 10 MG (21) TBPK tablet Take by mouth daily. Take 6 tabs by mouth daily for 1, then 5 tabs for 1 day, then 4 tabs for 1 day, then 3 tabs for 1 day, then 2 tabs for 1 day, then 1 tab for 1 day. 21 tablet Red Mandt, DO   chlorpheniramine-HYDROcodone (TUSSIONEX) 10-8 MG/5ML Take 5 mLs by mouth every 12 (twelve) hours as needed. 115 mL Sophiea Ueda, DO   albuterol (VENTOLIN HFA) 108 (90 Base) MCG/ACT inhaler Inhale 2 puffs into the lungs every 4 (four) hours as needed. 6.7 g Katha Cabal, DO      I have reviewed the PDMP during this encounter.   Katha Cabal, DO 07/06/23  [phone]

## 2023-07-04 NOTE — ED Triage Notes (Signed)
Patient states that she was exposed to RSV a few weeks ago. She's been coughing and sneezing for 21 days. Someday's she feels worse. Taking IBU.
